# Patient Record
Sex: Female | Born: 1995 | Race: White | Hispanic: No | Marital: Married | Smoking: Never smoker
Health system: Southern US, Community
[De-identification: ages and names within clinical notes are randomized; demographics above are authoritative.]

## PROBLEM LIST (undated history)

## (undated) DIAGNOSIS — N390 Urinary tract infection, site not specified: Secondary | ICD-10-CM

## (undated) DIAGNOSIS — F411 Generalized anxiety disorder: Secondary | ICD-10-CM

## (undated) DIAGNOSIS — K580 Irritable bowel syndrome with diarrhea: Secondary | ICD-10-CM

## (undated) HISTORY — PX: APPENDECTOMY: SHX54

---
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
## 2016-06-27 ENCOUNTER — Emergency Department (HOSPITAL_BASED_OUTPATIENT_CLINIC_OR_DEPARTMENT_OTHER)
Admission: EM | Admit: 2016-06-27 | Discharge: 2016-06-27 | Disposition: A | Discharge status: Home / Self Care | Payer: Commercial Managed Care - PPO | Attending: Emergency Medicine | Admitting: Emergency Medicine

## 2016-06-27 ENCOUNTER — Encounter (HOSPITAL_BASED_OUTPATIENT_CLINIC_OR_DEPARTMENT_OTHER): Payer: Self-pay | Admitting: Emergency Medicine

## 2016-06-27 DIAGNOSIS — R197 Diarrhea, unspecified: Secondary | ICD-10-CM | POA: Diagnosis not present

## 2016-06-27 DIAGNOSIS — R109 Unspecified abdominal pain: Secondary | ICD-10-CM

## 2016-06-27 DIAGNOSIS — R509 Fever, unspecified: Secondary | ICD-10-CM | POA: Diagnosis not present

## 2016-06-27 DIAGNOSIS — M791 Myalgia: Secondary | ICD-10-CM | POA: Diagnosis not present

## 2016-06-27 DIAGNOSIS — R112 Nausea with vomiting, unspecified: Secondary | ICD-10-CM | POA: Diagnosis not present

## 2016-06-27 DIAGNOSIS — R1013 Epigastric pain: Secondary | ICD-10-CM | POA: Insufficient documentation

## 2016-06-27 DIAGNOSIS — R0602 Shortness of breath: Secondary | ICD-10-CM | POA: Insufficient documentation

## 2016-06-27 LAB — CBC
HCT: 41.3 % (ref 36.0–46.0)
HEMOGLOBIN: 14.2 g/dL (ref 12.0–15.0)
MCH: 32 pg (ref 26.0–34.0)
MCHC: 34.4 g/dL (ref 30.0–36.0)
MCV: 93 fL (ref 78.0–100.0)
Platelets: 192 10*3/uL (ref 150–400)
RBC: 4.44 MIL/uL (ref 3.87–5.11)
RDW: 13.4 % (ref 11.5–15.5)
WBC: 11 10*3/uL — AB (ref 4.0–10.5)

## 2016-06-27 LAB — COMPREHENSIVE METABOLIC PANEL
ALK PHOS: 56 U/L (ref 38–126)
ALT: 18 U/L (ref 14–54)
ANION GAP: 9 (ref 5–15)
AST: 25 U/L (ref 15–41)
Albumin: 4.4 g/dL (ref 3.5–5.0)
BUN: 19 mg/dL (ref 6–20)
CALCIUM: 9.3 mg/dL (ref 8.9–10.3)
CO2: 22 mmol/L (ref 22–32)
Chloride: 106 mmol/L (ref 101–111)
Creatinine, Ser: 0.9 mg/dL (ref 0.44–1.00)
GFR calc Af Amer: >60 mL/min (ref >60)
GFR calc non Af Amer: >60 mL/min (ref >60)
Glucose, Bld: 114 mg/dL — ABNORMAL HIGH (ref 65–99)
Potassium: 3.8 mmol/L (ref 3.5–5.1)
SODIUM: 137 mmol/L (ref 135–145)
Total Bilirubin: 1.4 mg/dL — ABNORMAL HIGH (ref 0.3–1.2)
Total Protein: 7.5 g/dL (ref 6.5–8.1)

## 2016-06-27 LAB — URINALYSIS, MICROSCOPIC (REFLEX)

## 2016-06-27 LAB — URINALYSIS, ROUTINE W REFLEX MICROSCOPIC
Bilirubin Urine: NEGATIVE
Glucose, UA: NEGATIVE mg/dL
Ketones, ur: 15 mg/dL — AB
LEUKOCYTES UA: NEGATIVE
NITRITE: NEGATIVE
Protein, ur: NEGATIVE mg/dL
SPECIFIC GRAVITY, URINE: 1.021 (ref 1.005–1.030)
pH: 6 (ref 5.0–8.0)

## 2016-06-27 LAB — PREGNANCY, URINE: Preg Test, Ur: NEGATIVE

## 2016-06-27 LAB — LIPASE, BLOOD: LIPASE: 21 U/L (ref 11–51)

## 2016-06-27 MED ORDER — ONDANSETRON HCL 4 MG/2ML IJ SOLN
4.0000 mg | Freq: Once | INTRAMUSCULAR | Status: AC
  Administered 2016-06-27: 4 mg via INTRAVENOUS
  Filled 2016-06-27: qty 2

## 2016-06-27 MED ORDER — SODIUM CHLORIDE 0.9 % IV BOLUS (SEPSIS)
1000.0000 mL | Freq: Once | INTRAVENOUS | Status: AC
  Administered 2016-06-27: 1000 mL via INTRAVENOUS

## 2016-06-27 MED ORDER — ONDANSETRON 4 MG PO TBDP: 4.0000 mg | ORAL_TABLET | Freq: Three times a day (TID) | ORAL | 0 refills | Status: DC | PRN

## 2016-06-27 MED ORDER — ONDANSETRON HCL 4 MG/2ML IJ SOLN
4.0000 mg | Freq: Once | INTRAMUSCULAR | Status: AC | PRN
  Administered 2016-06-27: 4 mg via INTRAVENOUS
  Filled 2016-06-27: qty 2

## 2016-06-27 MED ORDER — RANITIDINE HCL 150 MG PO CAPS: 150.0000 mg | ORAL_CAPSULE | Freq: Every day | ORAL | 0 refills | Status: AC

## 2016-06-27 MED ORDER — DICYCLOMINE HCL 10 MG/ML IM SOLN
20.0000 mg | Freq: Once | INTRAMUSCULAR | Status: AC
  Administered 2016-06-27: 20 mg via INTRAMUSCULAR
  Filled 2016-06-27: qty 2

## 2016-06-27 MED ORDER — DICYCLOMINE HCL 20 MG PO TABS: 20.0000 mg | ORAL_TABLET | Freq: Two times a day (BID) | ORAL | 0 refills | Status: DC

## 2016-06-27 MED ORDER — GI COCKTAIL ~~LOC~~
30.0000 mL | Freq: Once | ORAL | Status: AC
  Administered 2016-06-27: 30 mL via ORAL
  Filled 2016-06-27: qty 30

## 2016-06-27 MED FILL — DICYCLOMINE 20 MG TABLET: 20 | 10 days supply | Qty: 20 | Fill #0

## 2016-06-27 MED FILL — ONDANSETRON ODT 4 MG TABLET: 4 | 2 days supply | Qty: 9 | Fill #0

## 2016-06-27 MED FILL — SM ACID REDUCER 150 MG TAB: 150 MG | 65 days supply | Qty: 65 | Fill #0

## 2016-06-27 NOTE — ED Provider Notes (Signed)
[facility]   CSN: [id number] Arrival date & time: 06/27/16  0749     History   Chief Complaint Chief Complaint  Patient presents with  . Emesis    HPI Kristina Fox is a 21 y.o. female.  HPI   21 yo female with no significant medical history presents with nausea, vomiting and diarrhea. Symptoms began last night. Has associated body aches.  Started out of nowhere last night. After dinner threw up, and continued through night 10-15 times and diarrhea 6 times.  Has been nonstop, not feeling better after emesis.  Abdominal pain all over, mainly in epigastrum. At first felt like cramping, now feels like feeling of emptiness.  Fever last night 100.2.  No known sick contacts, no travel, recent antibiotics. Works in day care.    Past Medical History:  Diagnosis Date  . UTI (lower urinary tract infection)     There are no active problems to display for this patient.   Past Surgical History:  Procedure Laterality Date  . APPENDECTOMY    . LAPAROSCOPIC APPENDECTOMY  11/25/2011   Procedure: APPENDECTOMY LAPAROSCOPIC;  Surgeon: Judie Petit. Leonia Corona, MD;  Location: [facility];  Service: Pediatrics;  Laterality: N/A;    OB History    No data available       Home Medications    Prior to Admission medications   Medication Sig Start Date End Date Taking? Authorizing Provider  dicyclomine (BENTYL) 20 MG tablet Take 1 tablet (20 mg total) by mouth 2 (two) times daily. 06/27/16   Alvira Monday, MD  ondansetron (ZOFRAN ODT) 4 MG disintegrating tablet Take 1 tablet (4 mg total) by mouth every 8 (eight) hours as needed for nausea or vomiting. 06/27/16   Alvira Monday, MD  ondansetron (ZOFRAN ODT) 4 MG disintegrating tablet Take 1-2 tablets (4-8 mg total) by mouth every 8 (eight) hours as needed for nausea or vomiting. 06/27/16   Alvira Monday, MD  ranitidine (ZANTAC) 150 MG capsule Take 1 capsule (150 mg total) by mouth daily. 06/27/16   Alvira Monday, MD     Family History History reviewed. No pertinent family history.  Social History Social History  Substance Use Topics  . Smoking status: Never Smoker  . Smokeless tobacco: Never Used  . Alcohol use No     Allergies   Patient has no known allergies.   Review of Systems Review of Systems  Constitutional: Positive for fever.  HENT: Negative for sore throat.   Eyes: Negative for visual disturbance.  Respiratory: Positive for shortness of breath (after vomiting, felt like may be anxiety). Negative for cough.   Cardiovascular: Negative for chest pain.  Gastrointestinal: Positive for diarrhea, nausea and vomiting. Negative for abdominal pain.  Genitourinary: Negative for difficulty urinating and dysuria.  Musculoskeletal: Positive for myalgias. Negative for back pain and neck pain.  Skin: Negative for rash.  Neurological: Negative for syncope and headaches.     Physical Exam Updated Vital Signs BP (!) 110/48 (BP Location: Right Arm)   Pulse 67   Temp 99.6 F (37.6 C) (Oral)   Resp 18   Ht  (1.702 m)   Wt 190 lb (86.2 kg)   LMP 06/27/2016   SpO2 100%   BMI 29.76 kg/m   Physical Exam  Constitutional: She is oriented to person, place, and time. She appears well-developed and well-nourished. No distress.  HENT:  Head: Normocephalic and atraumatic.  Eyes: Conjunctivae and EOM are normal.  Neck: Normal range of motion.  Cardiovascular: Normal rate, regular rhythm, normal heart sounds and intact distal pulses.  Exam reveals no gallop and no friction rub.   No murmur heard. Pulmonary/Chest: Effort normal and breath sounds normal. No respiratory distress. She has no wheezes. She has no rales.  Abdominal: Soft. She exhibits no distension. There is no tenderness. There is no guarding.  Musculoskeletal: She exhibits no edema or tenderness.  Neurological: She is alert and oriented to person, place, and time.  Skin: Skin is warm and dry. No rash noted. She is not  diaphoretic. No erythema.  Nursing note and vitals reviewed.    ED Treatments / Results  Labs (all labs ordered are listed, but only abnormal results are displayed) Labs Reviewed  COMPREHENSIVE METABOLIC PANEL - Abnormal; Notable for the following:       Result Value   Glucose, Bld 114 (*)    Total Bilirubin 1.4 (*)    All other components within normal limits  CBC - Abnormal; Notable for the following:    WBC 11.0 (*)    All other components within normal limits  URINALYSIS, ROUTINE W REFLEX MICROSCOPIC - Abnormal; Notable for the following:    Hgb urine dipstick LARGE (*)    Ketones, ur 15 (*)    All other components within normal limits  URINALYSIS, MICROSCOPIC (REFLEX) - Abnormal; Notable for the following:    Bacteria, UA RARE (*)    Squamous Epithelial / LPF 0-5 (*)    All other components within normal limits  LIPASE, BLOOD  PREGNANCY, URINE    EKG  EKG Interpretation None       Radiology No results found.  Procedures Procedures (including critical care time)  Medications Ordered in ED Medications  ondansetron (ZOFRAN) injection 4 mg (4 mg Intravenous Given 06/27/16 0819)  sodium chloride 0.9 % bolus 1,000 mL (0 mLs Intravenous Stopped 06/27/16 0939)  ondansetron (ZOFRAN) injection 4 mg (4 mg Intravenous Given 06/27/16 0954)  dicyclomine (BENTYL) injection 20 mg (20 mg Intramuscular Given 06/27/16 1052)  gi cocktail (Maalox,Lidocaine,Donnatal) (30 mLs Oral Given 06/27/16 1052)     Initial Impression / Assessment and Plan / ED Course  I have reviewed the triage vital signs and the nursing notes.  Pertinent labs & imaging results that were available during my care of the patient were reviewed by me and considered in my medical decision making (see chart for details).     21 yo female with hx of appendectomy, IBS, anxiety presents with nausea, vomiting and diarrhea.  Abdominal exam benign, doubt cholecystitis, SBO. Pregnancy test negative.  Labs show no UTI,  bili 1.4 with no transaminase abnormalities, WBC 11.  Given IV fluids and zofran x8mg . Able to tolerate po.  Pt reports continuing epigastric pain, given GI cocktail and bentyl with improvement.  Repeat examinations show no significant RUQ tenderness, negative murphy's sign. Discussed with patient option for [place], however at this time given suspected sick contacts at day care, diarrhea, no significant tenderness on exam I have low suspicion for cholecystitis and feel continued supportive care with strict return precautions for worsening pain is also appropriate. Patient would like to continue supportive care, return for worsening pain, high fevers, inability to tolerate po.    Given rx for zofran, bentyl, ranitidine. Patient discharged in stable condition with understanding of reasons to return.   Final Clinical Impressions(s) / ED Diagnoses   Final diagnoses:  Nausea vomiting and diarrhea  Abdominal cramping    New Prescriptions Discharge Medication List as of 06/27/2016  12:51 PM    START taking these medications   Details  dicyclomine (BENTYL) 20 MG tablet Take 1 tablet (20 mg total) by mouth 2 (two) times daily., Starting Thu 06/27/2016, Print    !! ondansetron (ZOFRAN ODT) 4 MG disintegrating tablet Take 1 tablet (4 mg total) by mouth every 8 (eight) hours as needed for nausea or vomiting., Starting Thu 06/27/2016, Print    !! ondansetron (ZOFRAN ODT) 4 MG disintegrating tablet Take 1-2 tablets (4-8 mg total) by mouth every 8 (eight) hours as needed for nausea or vomiting., Starting Thu 06/27/2016, Print    ranitidine (ZANTAC) 150 MG capsule Take 1 capsule (150 mg total) by mouth daily., Starting Thu 06/27/2016, Print     !! - Potential duplicate medications found. Please discuss with provider.       Alvira Monday, MD 06/27/16 2023

## 2016-06-27 NOTE — ED Triage Notes (Signed)
Patient states that she has been up all night with N/V/d  - reports generalize body aches

## 2016-07-01 ENCOUNTER — Emergency Department (HOSPITAL_BASED_OUTPATIENT_CLINIC_OR_DEPARTMENT_OTHER)
Admission: EM | Admit: 2016-07-01 | Discharge: 2016-07-01 | Disposition: A | Discharge status: Home / Self Care | Payer: Commercial Managed Care - PPO | Attending: Emergency Medicine | Admitting: Emergency Medicine

## 2016-07-01 ENCOUNTER — Encounter (HOSPITAL_BASED_OUTPATIENT_CLINIC_OR_DEPARTMENT_OTHER): Payer: Self-pay

## 2016-07-01 DIAGNOSIS — R109 Unspecified abdominal pain: Secondary | ICD-10-CM | POA: Diagnosis present

## 2016-07-01 DIAGNOSIS — A020 Salmonella enteritis: Secondary | ICD-10-CM | POA: Diagnosis not present

## 2016-07-01 HISTORY — DX: Irritable bowel syndrome with diarrhea: K58.0

## 2016-07-01 HISTORY — DX: Generalized anxiety disorder: F41.1

## 2016-07-01 LAB — URINALYSIS, ROUTINE W REFLEX MICROSCOPIC
Bilirubin Urine: NEGATIVE
GLUCOSE, UA: NEGATIVE mg/dL
Ketones, ur: NEGATIVE mg/dL
LEUKOCYTES UA: NEGATIVE
Nitrite: NEGATIVE
PH: 6 (ref 5.0–8.0)
Protein, ur: NEGATIVE mg/dL
SPECIFIC GRAVITY, URINE: 1.012 (ref 1.005–1.030)

## 2016-07-01 LAB — URINALYSIS, MICROSCOPIC (REFLEX): WBC, UA: NONE SEEN WBC/hpf (ref 0–5)

## 2016-07-01 LAB — PREGNANCY, URINE: Preg Test, Ur: NEGATIVE

## 2016-07-01 NOTE — ED Notes (Signed)
EMT checked pulse manually to verify pulse was in the 40's. RN notified.

## 2016-07-01 NOTE — Discharge Instructions (Signed)
Return to the emergency department if your symptoms significantly worsen or change. 

## 2016-07-01 NOTE — ED Notes (Signed)
ED Provider at bedside. 

## 2016-07-01 NOTE — ED Provider Notes (Signed)
[facility]   CSN: [id number] Arrival date & time: 07/01/16  4098   By signing my name below, I, Teofilo Pod, attest that this documentation has been prepared under the direction and in the presence of Geoffery Lyons, MD . Electronically Signed: Teofilo Pod, ED Scribe. 07/01/2016. 9:54 PM.   History   Chief Complaint Chief Complaint  Patient presents with  . Abdominal Pain    The history is provided by the patient. No language interpreter was used.   HPI Comments:  Kristina Fox is a 21 y.o. female who presents to the Emergency Department complaining of constant abdominal pain x 5 days. Mom reports that pt ate eggs 3 times last week, and the eggs that she ate were recalled due to salmonella. Pt was seen 5 days ago after vomiting several times over the night and was treated with Zofran with mild relief for nausea. The abdominal pain has moderately improved since. Pt notes that her BM was green this AM. She states that she is otherwise healthy. Pt denies other associated symptoms.   Past Medical History:  Diagnosis Date  . Generalized anxiety disorder   . Irritable bowel syndrome with diarrhea   . UTI (lower urinary tract infection)     There are no active problems to display for this patient.   Past Surgical History:  Procedure Laterality Date  . APPENDECTOMY    . LAPAROSCOPIC APPENDECTOMY  11/25/2011   Procedure: APPENDECTOMY LAPAROSCOPIC;  Surgeon: Judie Petit. Leonia Corona, MD;  Location: [facility];  Service: Pediatrics;  Laterality: N/A;    OB History    No data available       Home Medications    Prior to Admission medications   Medication Sig Start Date End Date Taking? Authorizing Provider  dicyclomine (BENTYL) 20 MG tablet Take 1 tablet (20 mg total) by mouth 2 (two) times daily. 06/27/16   Alvira Monday, MD  ondansetron (ZOFRAN ODT) 4 MG disintegrating tablet Take 1 tablet (4 mg total) by mouth every 8 (eight) hours as needed for  nausea or vomiting. 06/27/16   Alvira Monday, MD  ondansetron (ZOFRAN ODT) 4 MG disintegrating tablet Take 1-2 tablets (4-8 mg total) by mouth every 8 (eight) hours as needed for nausea or vomiting. 06/27/16   Alvira Monday, MD  ranitidine (ZANTAC) 150 MG capsule Take 1 capsule (150 mg total) by mouth daily. 06/27/16   Alvira Monday, MD    Family History No family history on file.  Social History Social History  Substance Use Topics  . Smoking status: Never Smoker  . Smokeless tobacco: Never Used  . Alcohol use No     Allergies   Patient has no known allergies.   Review of Systems Review of Systems  Constitutional: Negative for fever.  Gastrointestinal: Positive for abdominal pain, diarrhea, nausea and vomiting.  All other systems reviewed and are negative.    Physical Exam Updated Vital Signs BP (!) 125/56 (BP Location: Right Arm)   Pulse (!) 46   Temp 97.8 F (36.6 C) (Oral)   Resp 16   Ht  (1.702 m)   Wt 189 lb (85.7 kg)   LMP 06/27/2016   SpO2 100%   BMI 29.60 kg/m   Physical Exam  Constitutional: She is oriented to person, place, and time. She appears well-developed and well-nourished. No distress.  HENT:  Head: Normocephalic and atraumatic.  Eyes: EOM are normal.  Neck: Normal range of motion.  Cardiovascular: Normal rate, regular rhythm  and normal heart sounds.   Pulmonary/Chest: Effort normal and breath sounds normal.  Abdominal: Soft. She exhibits no distension. There is no tenderness.  Musculoskeletal: Normal range of motion.  Neurological: She is alert and oriented to person, place, and time.  Skin: Skin is warm and dry.  Psychiatric: She has a normal mood and affect. Judgment normal.  Nursing note and vitals reviewed.    ED Treatments / Results  DIAGNOSTIC STUDIES:  Oxygen Saturation is 100% on RA, normal by my interpretation.    COORDINATION OF CARE:  9:51 PM Discussed treatment plan with pt at bedside and pt agreed to plan.     Labs (all labs ordered are listed, but only abnormal results are displayed) Labs Reviewed  URINALYSIS, ROUTINE W REFLEX MICROSCOPIC - Abnormal; Notable for the following:       Result Value   Hgb urine dipstick MODERATE (*)    All other components within normal limits  URINALYSIS, MICROSCOPIC (REFLEX) - Abnormal; Notable for the following:    Bacteria, UA RARE (*)    Squamous Epithelial / LPF 0-5 (*)    All other components within normal limits  PREGNANCY, URINE    EKG  EKG Interpretation None       Radiology No results found.  Procedures Procedures (including critical care time)  Medications Ordered in ED Medications - No data to display   Initial Impression / Assessment and Plan / ED Course  I have reviewed the triage vital signs and the nursing notes.  Pertinent labs & imaging results that were available during my care of the patient were reviewed by me and considered in my medical decision making (see chart for details).  Patient was seen here several days ago for presumed gastroenteritis. Today, the mother realized that she had eaten eggs that were under the salmonella recall. The patient's mother called the primary Dr. who advised them to come to the ER to be evaluated even though the patient appears to be feeling better.  Her abdominal exam is benign and she is nontoxic appearing. She has had no vomiting and several days, but does still feel somewhat bloated and gassy. I see no indication for further workup. As she appears to be improving, I do not feel as though antibiotic therapy is indicated. To return as needed for any problems.  Final Clinical Impressions(s) / ED Diagnoses   Final diagnoses:  None    New Prescriptions New Prescriptions   No medications on file  I personally performed the services described in this documentation, which was scribed in my presence. The recorded information has been reviewed and is accurate.        Geoffery Lyons,  MD 07/02/16 [phone]

## 2016-07-01 NOTE — ED Triage Notes (Addendum)
Pt c/o abd cramping-was seen last week for n/v/d-pt realized she ate recalled eggs last week 2 days prior to sx-was advised by phone triage nurse to return to ED-pt NAD

## 2017-03-30 ENCOUNTER — Encounter (HOSPITAL_COMMUNITY): Payer: Self-pay | Admitting: Family Medicine

## 2017-03-30 ENCOUNTER — Ambulatory Visit (HOSPITAL_COMMUNITY)
Admission: EM | Admit: 2017-03-30 | Discharge: 2017-03-30 | Disposition: A | Discharge status: Home / Self Care | Payer: Commercial Managed Care - PPO | Attending: Family Medicine | Admitting: Family Medicine

## 2017-03-30 DIAGNOSIS — H6691 Otitis media, unspecified, right ear: Secondary | ICD-10-CM

## 2017-03-30 MED ORDER — HYDROCODONE-ACETAMINOPHEN 5-325 MG PO TABS: 1.0000 | ORAL_TABLET | Freq: Four times a day (QID) | ORAL | 0 refills | Status: DC | PRN

## 2017-03-30 MED ORDER — AMOXICILLIN 875 MG PO TABS: 875.0000 mg | ORAL_TABLET | Freq: Two times a day (BID) | ORAL | 0 refills | Status: DC

## 2017-03-30 NOTE — ED Provider Notes (Signed)
[facility]   [id number] 03/30/17 Arrival Time: 1848   SUBJECTIVE:  Kristina Fox is a 22 y.o. female who presents to the urgent care with complaint of very painful right ear and sore throat for 2 days.  She had similar symptoms 2 weeks ago and was seen but no diagnosis was forthcoming.  The symptoms resolved.  No fever.   Past Medical History:  Diagnosis Date  . Generalized anxiety disorder   . Irritable bowel syndrome with diarrhea   . UTI (lower urinary tract infection)    History reviewed. No pertinent family history. Social History   Socioeconomic History  . Marital status: Single    Spouse name: Not on file  . Number of children: Not on file  . Years of education: Not on file  . Highest education level: Not on file  Social Needs  . Financial resource strain: Not on file  . Food insecurity - worry: Not on file  . Food insecurity - inability: Not on file  . Transportation needs - medical: Not on file  . Transportation needs - non-medical: Not on file  Occupational History  . Not on file  Tobacco Use  . Smoking status: Never Smoker  . Smokeless tobacco: Never Used  Substance and Sexual Activity  . Alcohol use: No  . Drug use: No  . Sexual activity: Not on file  Other Topics Concern  . Not on file  Social History Narrative  . Not on file   No outpatient medications have been marked as taking for the 03/30/17 encounter [facility](Hospital Encounter).   No Known Allergies    ROS: As per HPI, remainder of ROS negative.   OBJECTIVE:   Vitals:   03/30/17 1918  BP: 118/66  Pulse: 83  Resp: 16  Temp: 98.5 F (36.9 C)  TempSrc: Oral  SpO2: 100%     General appearance: alert; no distress Eyes: PERRL; EOMI; conjunctiva normal HENT: normocephalic; atraumatic; right TM is deformed with wedge shaped white discoloration at 6 o'clock Neck: supple Back: no CVA tenderness Extremities: no cyanosis or edema; symmetrical with no gross deformities Skin: warm  and dry Neurologic: normal gait; grossly normal Psychological: alert and cooperative; normal mood and affect      Labs:  Results for orders placed or performed during the hospital encounter of 07/01/16  Pregnancy, urine  Result Value Ref Range   Preg Test, Ur NEGATIVE NEGATIVE  Urinalysis, Routine w reflex microscopic  Result Value Ref Range   Color, Urine YELLOW YELLOW   APPearance CLEAR CLEAR   Specific Gravity, Urine 1.012 1.005 - 1.030   pH 6.0 5.0 - 8.0   Glucose, UA NEGATIVE NEGATIVE mg/dL   Hgb urine dipstick MODERATE (A) NEGATIVE   Bilirubin Urine NEGATIVE NEGATIVE   Ketones, ur NEGATIVE NEGATIVE mg/dL   Protein, ur NEGATIVE NEGATIVE mg/dL   Nitrite NEGATIVE NEGATIVE   Leukocytes, UA NEGATIVE NEGATIVE  Urinalysis, Microscopic (reflex)  Result Value Ref Range   RBC / HPF 6-30 0 - 5 RBC/hpf   WBC, UA NONE SEEN 0 - 5 WBC/hpf   Bacteria, UA RARE (A) NONE SEEN   Squamous Epithelial / LPF 0-5 (A) NONE SEEN    Labs Reviewed - No data to display  No results found.     ASSESSMENT & PLAN:  1. Right otitis media, unspecified otitis media type     Meds ordered this encounter  Medications  . amoxicillin (AMOXIL) 875 MG tablet    Sig: Take 1 tablet (875  mg total) by mouth 2 (two) times daily.    Dispense:  20 tablet    Refill:  0  . HYDROcodone-acetaminophen (NORCO) 5-325 MG tablet    Sig: Take 1 tablet by mouth every 6 (six) hours as needed for moderate pain.    Dispense:  5 tablet    Refill:  0    Reviewed expectations re: course of current medical issues. Questions answered. Outlined signs and symptoms indicating need for more acute intervention. Patient verbalized understanding. After Visit Summary given.    Procedures:      Elvina Sidle, MD 03/30/17 1925

## 2017-03-30 NOTE — ED Triage Notes (Signed)
PT C/O: sore throat onset 2 days associated w/odonyphagia and right ear pain, nasal drainage, chills  DENIES: fevers  TAKING MEDS: Advil cold and sinu  A&O x4... NAD... Ambulatory

## 2022-08-12 ENCOUNTER — Emergency Department (HOSPITAL_COMMUNITY): Payer: 59

## 2022-08-12 ENCOUNTER — Other Ambulatory Visit: Payer: Self-pay

## 2022-08-12 ENCOUNTER — Encounter (HOSPITAL_COMMUNITY): Payer: Self-pay

## 2022-08-12 ENCOUNTER — Emergency Department (HOSPITAL_COMMUNITY)
Admission: EM | Admit: 2022-08-12 | Discharge: 2022-08-12 | Disposition: A | Discharge status: Home / Self Care | Payer: 59 | Attending: Emergency Medicine | Admitting: Emergency Medicine

## 2022-08-12 DIAGNOSIS — R072 Precordial pain: Secondary | ICD-10-CM | POA: Insufficient documentation

## 2022-08-12 DIAGNOSIS — R079 Chest pain, unspecified: Secondary | ICD-10-CM

## 2022-08-12 LAB — BASIC METABOLIC PANEL
Anion gap: 14 (ref 5–15)
BUN: 11 mg/dL (ref 6–20)
CO2: 19 mmol/L — ABNORMAL LOW (ref 22–32)
Calcium: 9.2 mg/dL (ref 8.9–10.3)
Chloride: 105 mmol/L (ref 98–111)
Creatinine, Ser: 0.96 mg/dL (ref 0.44–1.00)
GFR, Estimated: >60 mL/min (ref >60)
Glucose, Bld: 100 mg/dL — ABNORMAL HIGH (ref 70–99)
Potassium: 3.4 mmol/L — ABNORMAL LOW (ref 3.5–5.1)
Sodium: 138 mmol/L (ref 135–145)

## 2022-08-12 LAB — CBC
HCT: 41.2 % (ref 36.0–46.0)
Hemoglobin: 14.1 g/dL (ref 12.0–15.0)
MCH: 31.2 pg (ref 26.0–34.0)
MCHC: 34.2 g/dL (ref 30.0–36.0)
MCV: 91.2 fL (ref 80.0–100.0)
Platelets: 228 10*3/uL (ref 150–400)
RBC: 4.52 MIL/uL (ref 3.87–5.11)
RDW: 12.8 % (ref 11.5–15.5)
WBC: 8.8 10*3/uL (ref 4.0–10.5)
nRBC: 0 % (ref 0.0–0.2)

## 2022-08-12 LAB — I-STAT BETA HCG BLOOD, ED (MC, WL, AP ONLY): I-stat hCG, quantitative: <5 m[IU]/mL (ref <5)

## 2022-08-12 LAB — TROPONIN I (HIGH SENSITIVITY)
Troponin I (High Sensitivity): <2 ng/L (ref <18)
Troponin I (High Sensitivity): <2 ng/L (ref <18)

## 2022-08-12 MED ORDER — ACETAMINOPHEN 500 MG PO TABS
1000.0000 mg | ORAL_TABLET | ORAL | Status: AC
  Administered 2022-08-12: 1000 mg via ORAL
  Filled 2022-08-12: qty 2

## 2022-08-12 NOTE — ED Provider Notes (Signed)
[place] [facility]MENT AT [facility] Provider Note   CSN: [id number] Arrival date & time: 08/12/22  0249     History  Chief Complaint  Patient presents with   Chest Pain    Patient to ED via EMS with complaint of chest pain and hypotension x 1 hour. EMS reports giving patient 4 mg morphine IV in route.    Kristina Fox is a 27 y.o. female.   Chest Pain  Patient is a 27 year old female with no pertinent past medical history present emergency room today with complaints of sternal chest pain that woke her up from her sleep approximately an hour before arrival in the emergency department.  She denies any nausea or vomiting.  She states that her chest pain has improved since arrival in the emergency department.  She was given morphine by EMS in transit.  She states that the pain is sternal nonradiating nonexertional and seems to be constant does not seem to be aggravated by breathing coughing or moving around.  She states that it does seem to actually somewhat improved when she hunches forward.  She denies any trauma to her chest no new workouts.  No syncope or near syncope and no hemoptysis.  She denies any other associated symptoms  No recent surgeries, hospitalization, long travel, hemoptysis, estrogen containing OCP, cancer history.  No unilateral leg swelling.  No history of PE or VTE.     Home Medications Prior to Admission medications   Medication Sig Start Date End Date Taking? Authorizing Provider  amoxicillin (AMOXIL) 875 MG tablet Take 1 tablet (875 mg total) by mouth 2 (two) times daily. 03/30/17   Elvina Sidle, MD  dicyclomine (BENTYL) 20 MG tablet Take 1 tablet (20 mg total) by mouth 2 (two) times daily. 06/27/16   Alvira Monday, MD  HYDROcodone-acetaminophen (NORCO) 5-325 MG tablet Take 1 tablet by mouth every 6 (six) hours as needed for moderate pain. 03/30/17   Elvina Sidle, MD  ondansetron (ZOFRAN ODT) 4 MG disintegrating tablet Take 1  tablet (4 mg total) by mouth every 8 (eight) hours as needed for nausea or vomiting. 06/27/16   Alvira Monday, MD  ondansetron (ZOFRAN ODT) 4 MG disintegrating tablet Take 1-2 tablets (4-8 mg total) by mouth every 8 (eight) hours as needed for nausea or vomiting. 06/27/16   Alvira Monday, MD  ranitidine (ZANTAC) 150 MG capsule Take 1 capsule (150 mg total) by mouth daily. 06/27/16   Alvira Monday, MD      Allergies    Patient has no known allergies.    Review of Systems   Review of Systems  Cardiovascular:  Positive for chest pain.    Physical Exam Updated Vital Signs BP 114/67   Pulse 85   Temp (!) 97.5 F (36.4 C) (Oral)   Resp 17   Ht 5\' 7"  (1.702 m)   Wt 92.1 kg   SpO2 100%   BMI 31.79 kg/m  Physical Exam Vitals and nursing note reviewed.  Constitutional:      General: She is not in acute distress. HENT:     Head: Normocephalic and atraumatic.     Nose: Nose normal.     Mouth/Throat:     Mouth: Mucous membranes are moist.  Eyes:     General: No scleral icterus. Cardiovascular:     Rate and Rhythm: Normal rate and regular rhythm.     Pulses: Normal pulses.     Heart sounds: Normal heart sounds.  Pulmonary:  Effort: Pulmonary effort is normal. No respiratory distress.     Breath sounds: No wheezing.  Abdominal:     Palpations: Abdomen is soft.     Tenderness: There is no abdominal tenderness. There is no guarding or rebound.  Musculoskeletal:     Cervical back: Normal range of motion.     Right lower leg: No edema.     Left lower leg: No edema.     Comments: Legs symmetric  Skin:    General: Skin is warm and dry.     Capillary Refill: Capillary refill takes less than 2 seconds.  Neurological:     Mental Status: She is alert. Mental status is at baseline.  Psychiatric:        Mood and Affect: Mood normal.        Behavior: Behavior normal.     ED Results / Procedures / Treatments   Labs (all labs ordered are listed, but only abnormal results  are displayed) Labs Reviewed  BASIC METABOLIC PANEL - Abnormal; Notable for the following components:      Result Value   Potassium 3.4 (*)    CO2 19 (*)    Glucose, Bld 100 (*)    All other components within normal limits  CBC  I-STAT BETA HCG BLOOD, ED ([facility], [facility], [facility] ONLY)  TROPONIN I (HIGH SENSITIVITY)  TROPONIN I (HIGH SENSITIVITY)    EKG EKG Interpretation  Date/Time:  Monday Aug 12 2022 02:56:42 EDT Ventricular Rate:  65 PR Interval:  141 QRS Duration: 100 QT Interval:  427 QTC Calculation: 444 R Axis:   40 Text Interpretation: Sinus rhythm Normal ECG No old tracing to compare Confirmed by Dione Booze ([id number]) on 08/12/2022 2:58:34 AM  Radiology DG Chest 2 View  Result Date: 08/12/2022 CLINICAL DATA:  Chest pain. EXAM: CHEST - 2 VIEW COMPARISON:  None Available. FINDINGS: The heart size and mediastinal contours are within normal limits. Both lungs are clear. The visualized skeletal structures are unremarkable. IMPRESSION: No active cardiopulmonary disease. Electronically Signed   By: Aram Candela M.D.   On: 08/12/2022 03:42    Procedures Procedures    Medications Ordered in ED Medications  acetaminophen (TYLENOL) tablet 1,000 mg (1,000 mg Oral Given 08/12/22 0458)    ED Course/ Medical Decision Making/ A&P Clinical Course as of 08/12/22 6045  [facility] Aug 12, 2022  0459 Chest x-ray normal, EKG without evidence of acute ischemia CMP initial troponin normal.  hCG negative for pregnancy, second troponin pending [WF]    Clinical Course User Index [WF] Gailen Shelter, PA                             Medical Decision Making Amount and/or Complexity of Data Reviewed Labs: ordered. Radiology: ordered.  Risk OTC drugs.   This patient presents to the ED for concern of CP, this involves a number of treatment options, and is a complaint that carries with it a moderate risk of complications and morbidity. A differential diagnosis was considered for the patient's  symptoms which is discussed below:   The emergent causes of chest pain include: Acute coronary syndrome, tamponade, pericarditis/myocarditis, aortic dissection, pulmonary embolism, tension pneumothorax, pneumonia, and esophageal rupture.   I do not believe the patient has an emergent cause of chest pain, other urgent/non-acute considerations include, but are not limited to: chronic angina, aortic stenosis, cardiomyopathy, mitral valve prolapse, pulmonary hypertension, aortic insufficiency, right ventricular hypertrophy, pleuritis, bronchitis, pneumothorax, tumor,  gastroesophageal reflux disease (GERD), esophageal spasm, Mallory-Weiss syndrome, peptic ulcer disease, pancreatitis, functional gastrointestinal pain, cervical or thoracic disk disease or arthritis, shoulder arthritis, costochondritis, subacromial bursitis, anxiety or panic attack, herpes zoster, breast disorders, chest wall tumors, thoracic outlet syndrome, mediastinitis.    Co morbidities: Discussed in HPI   Brief History:  Patient is a 27 year old female with no pertinent past medical history present emergency room today with complaints of sternal chest pain that woke her up from her sleep approximately an hour before arrival in the emergency department.  She denies any nausea or vomiting.  She states that her chest pain has improved since arrival in the emergency department.  She was given morphine by EMS in transit.  She states that the pain is sternal nonradiating nonexertional and seems to be constant does not seem to be aggravated by breathing coughing or moving around.  She states that it does seem to actually somewhat improved when she hunches forward.  She denies any trauma to her chest no new workouts.  No syncope or near syncope and no hemoptysis.  She denies any other associated symptoms  No recent surgeries, hospitalization, long travel, hemoptysis, estrogen containing OCP, cancer history.  No unilateral leg swelling.  No  history of PE or VTE.     EMR reviewed including pt PMHx, past surgical history and past visits to ER.   See HPI for more details   Lab Tests:   I personally reviewed all laboratory work and imaging. Metabolic panel without any acute abnormality specifically kidney function within normal limits and no significant electrolyte abnormalities. CBC without leukocytosis or significant anemia. Troponin x 1 within normal limits/undetectable, i-STAT hCG negative pregnancy  Imaging Studies:  NAD. I personally reviewed all imaging studies and no acute abnormality found. I agree with radiology interpretation.    Cardiac Monitoring:  The patient was maintained on a cardiac monitor.  I personally viewed and interpreted the cardiac monitored which showed an underlying rhythm of: NSR EKG non-ischemic   Medicines ordered:  I ordered medication including Tylenol for pain Reevaluation of the patient after these medicines showed that the patient resolved I have reviewed the patients home medicines and have made adjustments as needed   Critical Interventions:     Consults/Attending Physician      Reevaluation:  After the interventions noted above I re-evaluated patient and found that they have :resolved   Social Determinants of Health:      Problem List / ED Course:  Patient with very atypical chest pain that awoke her from sleep.  Nonpleuritic nonexertional no radiation of pain seems to be sternal.  Better when she is hunched over but does not seem to be better or worse when she is leaning forward or reclined.  No dyspnea no hemoptysis PERC negative and I have very low suspicion for pulmonary embolism doubt myocarditis given normal initial troponin no recent viral illnesses that may have precipitated pericarditis.  Normal vital signs have a very low suspicion for tamponade.  Dissection very unlikely no neurologic symptoms no ripping tearing pain or radiation of back and no risk  factors for dissection.  Ultimately patient is now chest pain-free on reevaluation.  Pending second troponin and anticipate discharge home.   Dispostion:  Anticipate discharge home.  Patient care transferred to [facility] for re-evaluation and likely DC home after 2nd troponin.     Final Clinical Impression(s) / ED Diagnoses Final diagnoses:  None    Rx / DC Orders ED Discharge Orders  None         Solon Augusta [place], [place] 08/12/22 1610    Dione Booze, MD 08/12/22 [phone]

## 2022-08-12 NOTE — ED Provider Notes (Signed)
Care assumed from previous provider.  See note for full HPI  Intubation 27 year old here for evaluation of chest pain which began 1 hour PTA.  Pain nonexertional, nonpleuritic in nature.  No nausea, vomiting, shortness of breath, abdominal pain, back pain, lower extremity pain or swelling.  No history of PE or DVT.  No syncope, cough, URI symptoms  Workup thus far has been unremarkable.  Plan on follow-up on repeat trop, if neg plan to dc home.  Physical Exam  BP 114/67   Pulse 85   Temp (!) 97.5 F (36.4 C) (Oral)   Resp 17   Ht 5\' 7"  (1.702 m)   Wt 92.1 kg   SpO2 100%   BMI 31.79 kg/m   Physical Exam Vitals and nursing note reviewed.  Constitutional:      General: She is not in acute distress.    Appearance: She is well-developed. She is not ill-appearing.  HENT:     Head: Atraumatic.  Eyes:     Pupils: Pupils are equal, round, and reactive to light.  Cardiovascular:     Rate and Rhythm: Normal rate.     Pulses:          Radial pulses are 2+ on the right side and 2+ on the left side.       Dorsalis pedis pulses are 2+ on the right side and 2+ on the left side.     Heart sounds: Normal heart sounds.  Pulmonary:     Effort: Pulmonary effort is normal. No respiratory distress.     Breath sounds: Normal breath sounds.     Comments: Clear bilaterally, speaks in full sentences without difficulty Abdominal:     General: Bowel sounds are normal. There is no distension.     Palpations: Abdomen is soft.     Comments: Soft, nontender  Musculoskeletal:        General: Normal range of motion.     Cervical back: Normal range of motion.     Right lower leg: No tenderness. No edema.     Left lower leg: No tenderness. No edema.     Comments: Moves all 4 extremities at difficulty, compartments soft, nontender posterior calves 2+ DP pulses bilaterally  Skin:    General: Skin is warm and dry.     Capillary Refill: Capillary refill takes less than 2 seconds.  Neurological:      General: No focal deficit present.     Mental Status: She is alert.  Psychiatric:        Mood and Affect: Mood normal.     Procedures  Procedures Labs Reviewed  BASIC METABOLIC PANEL - Abnormal; Notable for the following components:      Result Value   Potassium 3.4 (*)    CO2 19 (*)    Glucose, Bld 100 (*)    All other components within normal limits  CBC  I-STAT BETA HCG BLOOD, ED ([facility], [facility], [facility] ONLY)  TROPONIN I (HIGH SENSITIVITY)  TROPONIN I (HIGH SENSITIVITY)   DG Chest 2 View  Result Date: 08/12/2022 CLINICAL DATA:  Chest pain. EXAM: CHEST - 2 VIEW COMPARISON:  None Available. FINDINGS: The heart size and mediastinal contours are within normal limits. Both lungs are clear. The visualized skeletal structures are unremarkable. IMPRESSION: No active cardiopulmonary disease. Electronically Signed   By: Aram Candela M.D.   On: 08/12/2022 03:42    ED Course / MDM  Care assumed from previous provider.  See note for full HPI  Intubation 27 year old here for evaluation of chest pain which began 1 hour PTA.  Pain nonexertional, nonpleuritic in nature.  No nausea, vomiting, shortness of breath, abdominal pain, back pain, lower extremity pain or swelling.  No history of PE or DVT.  No syncope, cough, URI symptoms  Workup thus far has been unremarkable.  Plan on follow-up on repeat trop, if neg plan to dc home.  PERC negative, Wells criteria low risk Heart score 0  Labs and imaging personally viewed and interpreted No significant abnormality  Patient assessed. CP Free.  She has no clinical evidence of VTE on exam, appears clinically well-hydrated.  Low suspicion for PE.  Symptoms not exertional in nature, low suspicion for ACS, unstable angina.  Normotensive low suspicion for hypertensive urgency or emergency.  Lungs clear bilaterally, no shortness of breath chest x-ray negative for pneumothorax, edema, cardiomegaly.  No crepitus on exam to suggest boerhaave. No syncope, potation to  suggest arrhythmia.  She is afebrile, low suspicion for myocarditis, pericarditis.  Her abdomen is soft, nontender, no nausea, vomiting, bloody stool, low suspicion for acute intra-abdominal etiology causing her symptoms.  The patient has been appropriately medically screened and/or stabilized in the ED. I have low suspicion for any other emergent medical condition which would require further screening, evaluation or treatment in the ED or require inpatient management.  Patient is hemodynamically stable and in no acute distress.  Patient able to ambulate in department prior to ED.  Evaluation does not show acute pathology that would require ongoing or additional emergent interventions while in the emergency department or further inpatient treatment.  I have discussed the diagnosis with the patient and answered all questions.  Pain is been managed while in the emergency department and patient has no further complaints prior to discharge.  Patient is comfortable with plan discussed in room and is stable for discharge at this time.  I have discussed strict return precautions for returning to the emergency department.  Patient was encouraged to follow-up with PCP/specialist refer to at discharge.    Clinical Course as of 08/12/22 2706  [facility] Aug 12, 2022  0459 Chest x-ray normal, EKG without evidence of acute ischemia CMP initial troponin normal.  hCG negative for pregnancy, second troponin pending [WF]    Clinical Course User Index [WF] Gailen Shelter, PA   Medical Decision Making Amount and/or Complexity of Data Reviewed Independent Historian: parent External Data Reviewed: labs, radiology, ECG and notes. Labs: ordered. Decision-making details documented in ED Course. Radiology: ordered and independent interpretation performed. Decision-making details documented in ED Course. ECG/medicine tests: ordered and independent interpretation performed. Decision-making details documented in ED  Course.  Risk OTC drugs. Prescription drug management. Decision regarding hospitalization. Diagnosis or treatment significantly limited by social determinants of health.     Chest pain     Tiffinie Caillier A, PA-C 08/12/22 2376    Pricilla Loveless, MD 08/12/22 1004

## 2022-08-12 NOTE — Discharge Instructions (Signed)
It was a pleasure taking care of you here in the emergency department  Your workup today was reassuring  Make sure to follow-up with your primary care provider  Return for new or worsening symptoms

## 2022-08-13 ENCOUNTER — Emergency Department (HOSPITAL_BASED_OUTPATIENT_CLINIC_OR_DEPARTMENT_OTHER): Payer: 59

## 2022-08-13 ENCOUNTER — Other Ambulatory Visit: Payer: Self-pay

## 2022-08-13 ENCOUNTER — Encounter (HOSPITAL_BASED_OUTPATIENT_CLINIC_OR_DEPARTMENT_OTHER): Payer: Self-pay | Admitting: Emergency Medicine

## 2022-08-13 ENCOUNTER — Observation Stay (HOSPITAL_BASED_OUTPATIENT_CLINIC_OR_DEPARTMENT_OTHER)
Admission: EM | Admit: 2022-08-13 | Discharge: 2022-08-15 | Disposition: A | Discharge status: Home / Self Care | Payer: 59 | Attending: Surgery | Admitting: Surgery

## 2022-08-13 DIAGNOSIS — R1013 Epigastric pain: Secondary | ICD-10-CM | POA: Diagnosis present

## 2022-08-13 DIAGNOSIS — F32A Depression, unspecified: Secondary | ICD-10-CM | POA: Diagnosis present

## 2022-08-13 DIAGNOSIS — K8000 Calculus of gallbladder with acute cholecystitis without obstruction: Principal | ICD-10-CM | POA: Insufficient documentation

## 2022-08-13 DIAGNOSIS — F411 Generalized anxiety disorder: Secondary | ICD-10-CM | POA: Diagnosis present

## 2022-08-13 DIAGNOSIS — K810 Acute cholecystitis: Principal | ICD-10-CM | POA: Diagnosis present

## 2022-08-13 DIAGNOSIS — Z79899 Other long term (current) drug therapy: Secondary | ICD-10-CM

## 2022-08-13 DIAGNOSIS — F419 Anxiety disorder, unspecified: Secondary | ICD-10-CM | POA: Diagnosis not present

## 2022-08-13 LAB — CBC WITH DIFFERENTIAL/PLATELET
Abs Immature Granulocytes: 0.02 10*3/uL (ref 0.00–0.07)
Basophils Absolute: 0.1 10*3/uL (ref 0.0–0.1)
Basophils Relative: 1 %
Eosinophils Absolute: 0 10*3/uL (ref 0.0–0.5)
Eosinophils Relative: 0 %
HCT: 40.1 % (ref 36.0–46.0)
Hemoglobin: 13.6 g/dL (ref 12.0–15.0)
Immature Granulocytes: 0 %
Lymphocytes Relative: 18 %
Lymphs Abs: 1.6 10*3/uL (ref 0.7–4.0)
MCH: 31.5 pg (ref 26.0–34.0)
MCHC: 33.9 g/dL (ref 30.0–36.0)
MCV: 92.8 fL (ref 80.0–100.0)
Monocytes Absolute: 0.6 10*3/uL (ref 0.1–1.0)
Monocytes Relative: 6 %
Neutro Abs: 6.5 10*3/uL (ref 1.7–7.7)
Neutrophils Relative %: 75 %
Platelets: 244 10*3/uL (ref 150–400)
RBC: 4.32 MIL/uL (ref 3.87–5.11)
RDW: 13 % (ref 11.5–15.5)
WBC: 8.7 10*3/uL (ref 4.0–10.5)
nRBC: 0 % (ref 0.0–0.2)

## 2022-08-13 LAB — SURGICAL PCR SCREEN
MRSA, PCR: NEGATIVE
Staphylococcus aureus: NEGATIVE

## 2022-08-13 LAB — COMPREHENSIVE METABOLIC PANEL
ALT: 26 U/L (ref 0–44)
AST: 27 U/L (ref 15–41)
Albumin: 4 g/dL (ref 3.5–5.0)
Alkaline Phosphatase: 46 U/L (ref 38–126)
Anion gap: 8 (ref 5–15)
BUN: 9 mg/dL (ref 6–20)
CO2: 25 mmol/L (ref 22–32)
Calcium: 9.1 mg/dL (ref 8.9–10.3)
Chloride: 104 mmol/L (ref 98–111)
Creatinine, Ser: 0.91 mg/dL (ref 0.44–1.00)
GFR, Estimated: >60 mL/min (ref >60)
Glucose, Bld: 95 mg/dL (ref 70–99)
Potassium: 4 mmol/L (ref 3.5–5.1)
Sodium: 137 mmol/L (ref 135–145)
Total Bilirubin: 0.7 mg/dL (ref 0.3–1.2)
Total Protein: 7.3 g/dL (ref 6.5–8.1)

## 2022-08-13 LAB — TROPONIN I (HIGH SENSITIVITY): Troponin I (High Sensitivity): <2 ng/L (ref <18)

## 2022-08-13 LAB — LIPASE, BLOOD: Lipase: 27 U/L (ref 11–51)

## 2022-08-13 MED ORDER — OXYCODONE HCL 5 MG PO TABS: 5.0000 mg | ORAL_TABLET | ORAL | Status: DC | PRN

## 2022-08-13 MED ORDER — ACETAMINOPHEN 325 MG PO TABS
650.0000 mg | ORAL_TABLET | Freq: Four times a day (QID) | ORAL | Status: DC
  Administered 2022-08-13 – 2022-08-15 (×3): 650 mg via ORAL
  Filled 2022-08-13 (×3): qty 2

## 2022-08-13 MED ORDER — ONDANSETRON HCL 4 MG/2ML IJ SOLN: 4.0000 mg | Freq: Three times a day (TID) | INTRAMUSCULAR | Status: DC | PRN

## 2022-08-13 MED ORDER — HYDROMORPHONE HCL 1 MG/ML IJ SOLN
1.0000 mg | INTRAMUSCULAR | Status: DC | PRN
  Administered 2022-08-14 (×2): 1 mg via INTRAVENOUS
  Filled 2022-08-13 (×2): qty 1

## 2022-08-13 MED ORDER — FENTANYL CITRATE PF 50 MCG/ML IJ SOSY: 50.0000 ug | PREFILLED_SYRINGE | INTRAMUSCULAR | Status: DC | PRN

## 2022-08-13 MED ORDER — LACTATED RINGERS IV SOLN: INTRAVENOUS | Status: DC

## 2022-08-13 MED ORDER — ENOXAPARIN SODIUM 40 MG/0.4ML IJ SOSY
40.0000 mg | PREFILLED_SYRINGE | INTRAMUSCULAR | Status: DC
  Administered 2022-08-13 – 2022-08-14 (×2): 40 mg via SUBCUTANEOUS
  Filled 2022-08-13 (×2): qty 0.4

## 2022-08-13 MED ORDER — ONDANSETRON HCL 4 MG/2ML IJ SOLN: 4.0000 mg | Freq: Four times a day (QID) | INTRAMUSCULAR | Status: DC | PRN

## 2022-08-13 MED ORDER — OXYCODONE HCL 5 MG PO TABS
10.0000 mg | ORAL_TABLET | ORAL | Status: DC | PRN
  Administered 2022-08-14: 10 mg via ORAL
  Filled 2022-08-13: qty 2

## 2022-08-13 MED ORDER — GABAPENTIN 300 MG PO CAPS
300.0000 mg | ORAL_CAPSULE | Freq: Three times a day (TID) | ORAL | Status: DC
  Administered 2022-08-14 (×2): 300 mg via ORAL
  Filled 2022-08-13 (×2): qty 3

## 2022-08-13 MED ORDER — PROCHLORPERAZINE EDISYLATE 10 MG/2ML IJ SOLN: 10.0000 mg | INTRAMUSCULAR | Status: DC | PRN

## 2022-08-13 MED ORDER — KETOROLAC TROMETHAMINE 15 MG/ML IJ SOLN
15.0000 mg | Freq: Three times a day (TID) | INTRAMUSCULAR | Status: DC
  Administered 2022-08-13 – 2022-08-15 (×4): 15 mg via INTRAVENOUS
  Filled 2022-08-13 (×4): qty 1

## 2022-08-13 MED ORDER — DOCUSATE SODIUM 100 MG PO CAPS
100.0000 mg | ORAL_CAPSULE | Freq: Two times a day (BID) | ORAL | Status: DC
  Administered 2022-08-14: 100 mg via ORAL
  Filled 2022-08-13: qty 1

## 2022-08-13 MED ORDER — SIMETHICONE 40 MG/0.6ML PO SUSP (UNIT DOSE)
80.0000 mg | Freq: Four times a day (QID) | ORAL | Status: DC | PRN
  Administered 2022-08-14: 80 mg via ORAL
  Filled 2022-08-13: qty 1.2

## 2022-08-13 NOTE — H&P (Signed)
Admitting Physician: Hyman Hopes Ellee Wawrzyniak  Service: General Surgery  CC: Abdominal pain  Subjective   HPI: Kristina Fox is an 27 y.o. female who is here for abdominal pain.  Pain located in the epigastric and chest areas.  Came into the ER twice and was found on right upper quadrant ultrasound to have signs of acute cholecystitis so a surgery evaluation was requested.  The pain causes some shortness of breath.  Past Medical History:  Diagnosis Date   Generalized anxiety disorder    Irritable bowel syndrome with diarrhea    UTI (lower urinary tract infection)     Past Surgical History:  Procedure Laterality Date   APPENDECTOMY     LAPAROSCOPIC APPENDECTOMY  11/25/2011   Procedure: APPENDECTOMY LAPAROSCOPIC;  Surgeon: Judie Petit. Leonia Corona, MD;  Location: [facility] OR;  Service: Pediatrics;  Laterality: N/A;    History reviewed. No pertinent family history.  Social:  reports that she has never smoked. She has never used smokeless tobacco. She reports that she does not drink alcohol and does not use drugs.  Allergies: No Known Allergies  Medications: Current Outpatient Medications  Medication Instructions   ranitidine (ZANTAC) 150 mg, Oral, Daily    ROS - all of the below systems have been reviewed with the patient and positives are indicated with bold text General: chills, fever or night sweats Eyes: blurry vision or double vision ENT: epistaxis or sore throat Allergy/Immunology: itchy/watery eyes or nasal congestion Hematologic/Lymphatic: bleeding problems, blood clots or swollen lymph nodes Endocrine: temperature intolerance or unexpected weight changes Breast: new or changing breast lumps or nipple discharge Resp: cough, shortness of breath, or wheezing CV: chest pain or dyspnea on exertion GI: as per HPI GU: dysuria, trouble voiding, or hematuria MSK: joint pain or joint stiffness Neuro: TIA or stroke symptoms Derm: pruritus and skin lesion changes Psych: anxiety and  depression  Objective   PE Blood pressure 95/65, pulse (!) 56, temperature 98.2 F (36.8 C), temperature source Oral, resp. rate 18, height 5\' 7"  (1.702 m), weight 91.6 kg, last menstrual period 07/30/2022, SpO2 97 %. Constitutional: NAD; conversant; no deformities Eyes: Moist conjunctiva; no lid lag; anicteric; PERRL Neck: Trachea midline; no thyromegaly Lungs: Normal respiratory effort; no tactile fremitus CV: RRR; no palpable thrills; no pitting edema GI: Abd Soft, tender RUQ; no palpable hepatosplenomegaly MSK: Normal range of motion of extremities; no clubbing/cyanosis Psychiatric: Appropriate affect; alert and oriented x3 Lymphatic: No palpable cervical or axillary lymphadenopathy  Results for orders placed or performed during the hospital encounter of 08/13/22 (from the past 24 hour(s))  CBC with Differential     Status: None   Collection Time: 08/13/22  2:01 PM  Result Value Ref Range   WBC 8.7 4.0 - 10.5 K/uL   RBC 4.32 3.87 - 5.11 MIL/uL   Hemoglobin 13.6 12.0 - 15.0 g/dL   HCT 16.1 09.6 - 04.5 %   MCV 92.8 80.0 - 100.0 fL   MCH 31.5 26.0 - 34.0 pg   MCHC 33.9 30.0 - 36.0 g/dL   RDW 40.9 81.1 - 91.4 %   Platelets 244 150 - 400 K/uL   nRBC 0.0 0.0 - 0.2 %   Neutrophils Relative % 75 %   Neutro Abs 6.5 1.7 - 7.7 K/uL   Lymphocytes Relative 18 %   Lymphs Abs 1.6 0.7 - 4.0 K/uL   Monocytes Relative 6 %   Monocytes Absolute 0.6 0.1 - 1.0 K/uL   Eosinophils Relative 0 %   Eosinophils Absolute 0.0  0.0 - 0.5 K/uL   Basophils Relative 1 %   Basophils Absolute 0.1 0.0 - 0.1 K/uL   Immature Granulocytes 0 %   Abs Immature Granulocytes 0.02 0.00 - 0.07 K/uL  Comprehensive metabolic panel     Status: None   Collection Time: 08/13/22  2:01 PM  Result Value Ref Range   Sodium 137 135 - 145 mmol/L   Potassium 4.0 3.5 - 5.1 mmol/L   Chloride 104 98 - 111 mmol/L   CO2 25 22 - 32 mmol/L   Glucose, Bld 95 70 - 99 mg/dL   BUN 9 6 - 20 mg/dL   Creatinine, Ser 1.61 0.44 - 1.00  mg/dL   Calcium 9.1 8.9 - 09.6 mg/dL   Total Protein 7.3 6.5 - 8.1 g/dL   Albumin 4.0 3.5 - 5.0 g/dL   AST 27 15 - 41 U/L   ALT 26 0 - 44 U/L   Alkaline Phosphatase 46 38 - 126 U/L   Total Bilirubin 0.7 0.3 - 1.2 mg/dL   GFR, Estimated >04 >54 mL/min   Anion gap 8 5 - 15  Lipase, blood     Status: None   Collection Time: 08/13/22  2:01 PM  Result Value Ref Range   Lipase 27 11 - 51 U/L  Troponin I (High Sensitivity)     Status: None   Collection Time: 08/13/22  2:01 PM  Result Value Ref Range   Troponin I (High Sensitivity) <2 <18 ng/L  Surgical pcr screen     Status: None   Collection Time: 08/13/22  8:59 PM   Specimen: Nasal Mucosa; Nasal Swab  Result Value Ref Range   MRSA, PCR NEGATIVE NEGATIVE   Staphylococcus aureus NEGATIVE NEGATIVE     Imaging Orders    US Abdomen Limited RUQ (LIVER/GB)   08/13/22 Abnormal gallbladder which is dilated. There is wall thickening and wall edema with stones and sludge. Worrisome for acute cholecystitis. If needed, confirmatory HIDA scan could be performed as clinically appropriate. No biliary ductal dilatation.  DG Chest 2 View   08/12/22 No active cardiopulmonary disease.   Assessment and Plan   Kristina Fox is an 27 y.o. female with abdominal pain found on ultrasound to have cholecystitis.  I recommended laparoscopic cholecystectomy.  We discussed the procedure, its risks, benefits and alteratives.  After a full discussion and all questions answered the patient granted consent to proceed.  I discussed the case with Dr. Luisa Hart who will be taking over the patient's care.  Surgery is scheduled for later today.   Quentin Ore, MD  [facility] Use [email or web address] to contact on call provider  New Patient Billing: [id number] - High MDM

## 2022-08-13 NOTE — ED Provider Notes (Signed)
[facility] Provider Note   CSN: [id number] Arrival date & time: 08/13/22  1353     History  Chief Complaint  Patient presents with   Chest Pain    Bereniz Tocci is a 27 y.o. female.   Chest Pain    27 year old female presenting to the emergency department with epigastric pain and chest pain.  The patient was seen in the emergency department yesterday for similar complaint and had a negative cardiac workup and was subsequently discharged.  She still had persistent pain and discomfort and presents today requesting right upper quadrant ultrasound.  She states that her symptoms started yesterday when she developed a tightness in her epigastrium that radiated up to her chest.  She had some associated nausea.  No vomiting.  Pain did not radiate to her back.  Some component of pain in the right upper quadrant.  Workup yesterday reviewed significant for negative troponins, patient was PERC negative, EKG nonischemic.  She endorsed some shortness of breath associated with the pain.    Home Medications Prior to Admission medications   Medication Sig Start Date End Date Taking? Authorizing Provider  ranitidine (ZANTAC) 150 MG capsule Take 1 capsule (150 mg total) by mouth daily. 06/27/16   Alvira Monday, MD      Allergies    Patient has no known allergies.    Review of Systems   Review of Systems  Cardiovascular:  Positive for chest pain.    Physical Exam Updated Vital Signs BP 112/76   Pulse 68   Temp 98.9 F (37.2 C)   Resp 16   Ht 5\' 7"  (1.702 m)   Wt 91.6 kg   LMP 07/30/2022   SpO2 100%   BMI 31.64 kg/m  Physical Exam Vitals and nursing note reviewed.  Constitutional:      General: She is not in acute distress.    Appearance: She is well-developed.  HENT:     Head: Normocephalic and atraumatic.  Eyes:     Conjunctiva/sclera: Conjunctivae normal.  Cardiovascular:     Rate and Rhythm: Normal rate and regular rhythm.      Heart sounds: No murmur heard. Pulmonary:     Effort: Pulmonary effort is normal. No respiratory distress.     Breath sounds: Normal breath sounds.  Abdominal:     Palpations: Abdomen is soft.     Tenderness: There is abdominal tenderness in the right upper quadrant. There is guarding. Positive signs include Murphy's sign.  Musculoskeletal:        General: No swelling.     Cervical back: Neck supple.  Skin:    General: Skin is warm and dry.     Capillary Refill: Capillary refill takes less than 2 seconds.  Neurological:     Mental Status: She is alert.  Psychiatric:        Mood and Affect: Mood normal.     ED Results / Procedures / Treatments   Labs (all labs ordered are listed, but only abnormal results are displayed) Labs Reviewed  CBC WITH DIFFERENTIAL/PLATELET  COMPREHENSIVE METABOLIC PANEL  LIPASE, BLOOD  HIV ANTIBODY (ROUTINE TESTING W REFLEX)  CBC  CREATININE, SERUM  BASIC METABOLIC PANEL  CBC  HEPATIC FUNCTION PANEL  TROPONIN I (HIGH SENSITIVITY)    EKG EKG Interpretation  Date/Time:  Tuesday Aug 13 2022 15:57:16 EDT Ventricular Rate:  62 PR Interval:  145 QRS Duration: 84 QT Interval:  390 QTC Calculation: 396 R Axis:   47  Text Interpretation: Sinus arrhythmia No significant change since last tracing Confirmed by Ernie Avena ([id number]) on 08/13/2022 4:15:51 PM  Radiology DG Chest 2 View  Result Date: 08/13/2022 CLINICAL DATA:  Epigastric pain, substernal chest pain EXAM: CHEST - 2 VIEW COMPARISON:  08/12/2022 FINDINGS: The heart size and mediastinal contours are within normal limits. Both lungs are clear. The visualized skeletal structures are unremarkable. IMPRESSION: No active cardiopulmonary disease. Electronically Signed   By: Judie Petit.  Shick M.D.   On: 08/13/2022 17:47   US Abdomen Limited RUQ (LIVER/GB)  Result Date: 08/13/2022 CLINICAL DATA:  Right upper quadrant pain EXAM: ULTRASOUND ABDOMEN LIMITED RIGHT UPPER QUADRANT COMPARISON:  CT 11/24/2011  FINDINGS: Gallbladder: Distended gallbladder with some wall thickening and adjacent fluid with wall edema. Dependent stones and sludge. No reported sonographic Murphy's sign. Wall thickness approaches 7 mm. Common bile duct: Diameter: 3 mm Liver: No focal lesion identified. Within normal limits in parenchymal echogenicity. Portal vein is patent on color Doppler imaging with normal direction of blood flow towards the liver. Other: None. IMPRESSION: Abnormal gallbladder which is dilated. There is wall thickening and wall edema with stones and sludge. Worrisome for acute cholecystitis. If needed, confirmatory HIDA scan could be performed as clinically appropriate. No biliary ductal dilatation. Electronically Signed   By: Karen Kays M.D.   On: 08/13/2022 16:43   DG Chest 2 View  Result Date: 08/12/2022 CLINICAL DATA:  Chest pain. EXAM: CHEST - 2 VIEW COMPARISON:  None Available. FINDINGS: The heart size and mediastinal contours are within normal limits. Both lungs are clear. The visualized skeletal structures are unremarkable. IMPRESSION: No active cardiopulmonary disease. Electronically Signed   By: Aram Candela M.D.   On: 08/12/2022 03:42    Procedures Procedures    Medications Ordered in ED Medications  ondansetron (ZOFRAN) injection 4 mg (has no administration in time range)  fentaNYL (SUBLIMAZE) injection 50 mcg (has no administration in time range)  enoxaparin (LOVENOX) injection 40 mg (has no administration in time range)  lactated ringers infusion (has no administration in time range)  acetaminophen (TYLENOL) tablet 650 mg (has no administration in time range)  ketorolac (TORADOL) 15 MG/ML injection 15 mg (has no administration in time range)  gabapentin (NEURONTIN) capsule 300 mg (has no administration in time range)  oxyCODONE (Oxy IR/ROXICODONE) immediate release tablet 5 mg (has no administration in time range)  oxyCODONE (Oxy IR/ROXICODONE) immediate release tablet 10 mg (has no  administration in time range)  HYDROmorphone (DILAUDID) injection 1 mg (has no administration in time range)  ondansetron (ZOFRAN) injection 4 mg (has no administration in time range)  prochlorperazine (COMPAZINE) injection 10 mg (has no administration in time range)  simethicone (MYLICON) 40 mg/0.39ml suspension 80 mg (has no administration in time range)  docusate sodium (COLACE) capsule 100 mg (has no administration in time range)    ED Course/ Medical Decision Making/ A&P                             Medical Decision Making Amount and/or Complexity of Data Reviewed Radiology: ordered.  Risk Decision regarding hospitalization.    27 year old female presenting to the emergency department with epigastric pain and chest pain.  The patient was seen in the emergency department yesterday for similar complaint and had a negative cardiac workup and was subsequently discharged.  She still had persistent pain and discomfort and presents today requesting right upper quadrant ultrasound.  She states that her symptoms started yesterday  when she developed a tightness in her epigastrium that radiated up to her chest.  She had some associated nausea.  No vomiting.  Pain did not radiate to her back.  Some component of pain in the right upper quadrant.  Workup yesterday reviewed significant for negative troponins, patient was PERC negative, EKG nonischemic.  She endorsed some shortness of breath associated with the pain.    On arrival, the patient was afebrile, not tachycardic or tachypneic, hemodynamically stable, saturating well on room air.  Physical exam concerning for right upper quadrant and epigastric tenderness to palpation, Guarding present, positive Murphy sign noted on exam.  Concern for acute cholecystitis versus symptomatic cholelithiasis, additionally considered anxiety/panic attack, pancreatitis, GERD, gastritis.  Previously had been ruled out for ACS yesterday in the ER.  PERC negative, low  concern for PE.  Initial EKG revealed sinus arrhythmia, ventricular rate 62, no acute ischemic changes.  A chest x-ray revealed no active cardiopulmonary disease.  Right upper quadrant ultrasound was performed which was concerning for acute cholecystitis as below:  RUQ [place]: IMPRESSION:  Abnormal gallbladder which is dilated. There is wall thickening and  wall edema with stones and sludge. Worrisome for acute  cholecystitis. If needed, confirmatory HIDA scan could be performed  as clinically appropriate.    No biliary ductal dilatation.   I spoke with Dr. Dossie Der, of on-call general surgery who recommended admission to [facility] to their service for consideration for surgical management.  On repeat assessment, the patient's pain was well-controlled.  Fentanyl and Zofran as needed were ordered as needed.  Patient was updated regarding the plan of care.  The patient was subsequently admitted in stable condition.   Final Clinical Impression(s) / ED Diagnoses Final diagnoses:  Acute cholecystitis    Rx / DC Orders ED Discharge Orders     None         Ernie Avena, MD 08/13/22 1758

## 2022-08-13 NOTE — ED Triage Notes (Signed)
Patient arrives ambulatory by POV c/o chest pain worsening throughout the day. Patient states she was seen at [facility] yesterday and had cardiac work up. Requesting her gall bladder to be evaluated.

## 2022-08-13 NOTE — ED Notes (Signed)
Pt ambulatory to restroom with independent steady gait °

## 2022-08-14 ENCOUNTER — Encounter (HOSPITAL_COMMUNITY)
Admission: EM | Disposition: A | Discharge status: Home / Self Care | Payer: Self-pay | Source: Home / Self Care | Attending: Emergency Medicine

## 2022-08-14 ENCOUNTER — Other Ambulatory Visit: Payer: Self-pay

## 2022-08-14 ENCOUNTER — Inpatient Hospital Stay (HOSPITAL_COMMUNITY): Payer: 59 | Admitting: Anesthesiology

## 2022-08-14 ENCOUNTER — Encounter (HOSPITAL_COMMUNITY): Payer: Self-pay

## 2022-08-14 DIAGNOSIS — K810 Acute cholecystitis: Secondary | ICD-10-CM

## 2022-08-14 DIAGNOSIS — F419 Anxiety disorder, unspecified: Secondary | ICD-10-CM

## 2022-08-14 HISTORY — PX: CHOLECYSTECTOMY: SHX55

## 2022-08-14 SURGERY — LAPAROSCOPIC CHOLECYSTECTOMY WITH INTRAOPERATIVE CHOLANGIOGRAM
Anesthesia: General

## 2022-08-14 MED ORDER — LIDOCAINE HCL (PF) 2 % IJ SOLN
INTRAMUSCULAR | Status: AC
  Filled 2022-08-14: qty 5

## 2022-08-14 MED ORDER — FENTANYL CITRATE PF 50 MCG/ML IJ SOSY
PREFILLED_SYRINGE | INTRAMUSCULAR | Status: AC
  Filled 2022-08-14: qty 1

## 2022-08-14 MED ORDER — DEXAMETHASONE SODIUM PHOSPHATE 10 MG/ML IJ SOLN
INTRAMUSCULAR | Status: DC | PRN
  Administered 2022-08-14: 10 mg via INTRAVENOUS

## 2022-08-14 MED ORDER — DEXAMETHASONE SODIUM PHOSPHATE 10 MG/ML IJ SOLN
INTRAMUSCULAR | Status: AC
  Filled 2022-08-14: qty 1

## 2022-08-14 MED ORDER — ONDANSETRON HCL 4 MG/2ML IJ SOLN
INTRAMUSCULAR | Status: DC | PRN
  Administered 2022-08-14: 4 mg via INTRAVENOUS

## 2022-08-14 MED ORDER — SODIUM CHLORIDE 0.9 % IV SOLN
1.0000 g | Freq: Once | INTRAVENOUS | Status: AC
  Administered 2022-08-14: 1 g via INTRAVENOUS
  Filled 2022-08-14: qty 10

## 2022-08-14 MED ORDER — LACTATED RINGERS IV SOLN
INTRAVENOUS | Status: DC | PRN
  Administered 2022-08-14: 1000 mL

## 2022-08-14 MED ORDER — PROPOFOL 10 MG/ML IV BOLUS
INTRAVENOUS | Status: DC | PRN
  Administered 2022-08-14: 200 mg via INTRAVENOUS

## 2022-08-14 MED ORDER — METHOCARBAMOL 500 MG PO TABS
500.0000 mg | ORAL_TABLET | Freq: Three times a day (TID) | ORAL | Status: DC | PRN
  Administered 2022-08-14: 500 mg via ORAL
  Filled 2022-08-14: qty 1

## 2022-08-14 MED ORDER — FENTANYL CITRATE (PF) 100 MCG/2ML IJ SOLN
INTRAMUSCULAR | Status: DC | PRN
  Administered 2022-08-14 (×3): 50 ug via INTRAVENOUS
  Administered 2022-08-14: 100 ug via INTRAVENOUS

## 2022-08-14 MED ORDER — BUPIVACAINE HCL 0.25 % IJ SOLN
INTRAMUSCULAR | Status: AC
  Filled 2022-08-14: qty 1

## 2022-08-14 MED ORDER — FENTANYL CITRATE (PF) 250 MCG/5ML IJ SOLN
INTRAMUSCULAR | Status: AC
  Filled 2022-08-14: qty 5

## 2022-08-14 MED ORDER — ORAL CARE MOUTH RINSE: 15.0000 mL | Freq: Once | OROMUCOSAL | Status: AC

## 2022-08-14 MED ORDER — AMISULPRIDE (ANTIEMETIC) 5 MG/2ML IV SOLN: 10.0000 mg | Freq: Once | INTRAVENOUS | Status: DC | PRN

## 2022-08-14 MED ORDER — PROPOFOL 10 MG/ML IV BOLUS
INTRAVENOUS | Status: AC
  Filled 2022-08-14: qty 20

## 2022-08-14 MED ORDER — ONDANSETRON HCL 4 MG/2ML IJ SOLN
INTRAMUSCULAR | Status: AC
  Filled 2022-08-14: qty 2

## 2022-08-14 MED ORDER — MIDAZOLAM HCL 2 MG/2ML IJ SOLN
INTRAMUSCULAR | Status: AC
  Filled 2022-08-14: qty 2

## 2022-08-14 MED ORDER — ROCURONIUM BROMIDE 10 MG/ML (PF) SYRINGE
PREFILLED_SYRINGE | INTRAVENOUS | Status: DC | PRN
  Administered 2022-08-14: 60 mg via INTRAVENOUS

## 2022-08-14 MED ORDER — LACTATED RINGERS IV SOLN: INTRAVENOUS | Status: DC

## 2022-08-14 MED ORDER — SUGAMMADEX SODIUM 200 MG/2ML IV SOLN
INTRAVENOUS | Status: DC | PRN
  Administered 2022-08-14: 200 mg via INTRAVENOUS

## 2022-08-14 MED ORDER — CHLORHEXIDINE GLUCONATE 0.12 % MT SOLN
15.0000 mL | Freq: Once | OROMUCOSAL | Status: AC
  Administered 2022-08-14: 15 mL via OROMUCOSAL

## 2022-08-14 MED ORDER — FENTANYL CITRATE PF 50 MCG/ML IJ SOSY
25.0000 ug | PREFILLED_SYRINGE | INTRAMUSCULAR | Status: DC | PRN
  Administered 2022-08-14 (×2): 50 ug via INTRAVENOUS

## 2022-08-14 MED ORDER — PROMETHAZINE HCL 25 MG/ML IJ SOLN: 6.2500 mg | INTRAMUSCULAR | Status: DC | PRN

## 2022-08-14 MED ORDER — ACETAMINOPHEN 500 MG PO TABS
1000.0000 mg | ORAL_TABLET | Freq: Once | ORAL | Status: AC
  Administered 2022-08-14: 1000 mg via ORAL
  Filled 2022-08-14: qty 2

## 2022-08-14 MED ORDER — SCOPOLAMINE 1 MG/3DAYS TD PT72
1.0000 | MEDICATED_PATCH | TRANSDERMAL | Status: DC
  Administered 2022-08-14: 1.5 mg via TRANSDERMAL
  Filled 2022-08-14: qty 1

## 2022-08-14 MED ORDER — LIDOCAINE 2% (20 MG/ML) 5 ML SYRINGE
INTRAMUSCULAR | Status: DC | PRN
  Administered 2022-08-14: 80 mg via INTRAVENOUS

## 2022-08-14 MED ORDER — ROCURONIUM BROMIDE 10 MG/ML (PF) SYRINGE
PREFILLED_SYRINGE | INTRAVENOUS | Status: AC
  Filled 2022-08-14: qty 10

## 2022-08-14 MED ORDER — OXYCODONE HCL 5 MG PO TABS: 5.0000 mg | ORAL_TABLET | ORAL | Status: DC | PRN

## 2022-08-14 MED ORDER — BUPIVACAINE HCL (PF) 0.25 % IJ SOLN
INTRAMUSCULAR | Status: DC | PRN
  Administered 2022-08-14: 10 mL

## 2022-08-14 MED ORDER — MIDAZOLAM HCL 5 MG/5ML IJ SOLN
INTRAMUSCULAR | Status: DC | PRN
  Administered 2022-08-14: 2 mg via INTRAVENOUS

## 2022-08-14 SURGICAL SUPPLY — 33 items
ADH SKN CLS APL DERMABOND .7 (GAUZE/BANDAGES/DRESSINGS) ×1
APL PRP STRL LF DISP 70% ISPRP (MISCELLANEOUS) ×1
APPLIER CLIP ROT 10 11.4 M/L (STAPLE) ×1
APR CLP MED LRG 11.4X10 (STAPLE) ×1
BAG SPEC RTRVL 10 TROC 200 (ENDOMECHANICALS) ×1
CHLORAPREP W/TINT 26 (MISCELLANEOUS) ×2 IMPLANT
CLIP APPLIE ROT 10 11.4 M/L (STAPLE) ×2 IMPLANT
COVER MAYO STAND XLG (MISCELLANEOUS) IMPLANT
DERMABOND ADVANCED .7 DNX12 (GAUZE/BANDAGES/DRESSINGS) ×2 IMPLANT
DRAPE C-ARM 42X120 X-RAY (DRAPES) IMPLANT
ELECT REM PT RETURN 15FT ADLT (MISCELLANEOUS) ×2 IMPLANT
GLOVE INDICATOR 8.0 STRL GRN (GLOVE) ×2 IMPLANT
GLOVE SS BIOGEL STRL SZ 8 (GLOVE) ×2 IMPLANT
GOWN STRL REUS W/ TWL XL LVL3 (GOWN DISPOSABLE) ×4 IMPLANT
GOWN STRL REUS W/TWL XL LVL3 (GOWN DISPOSABLE) ×2
HEMOSTAT SURGICEL 4X8 (HEMOSTASIS) IMPLANT
IRRIG SUCT STRYKERFLOW 2 WTIP (MISCELLANEOUS) ×1
IRRIGATION SUCT STRKRFLW 2 WTP (MISCELLANEOUS) ×2 IMPLANT
KIT BASIN OR (CUSTOM PROCEDURE TRAY) ×2 IMPLANT
KIT TURNOVER KIT A (KITS) IMPLANT
POUCH RETRIEVAL ECOSAC 10 (ENDOMECHANICALS) IMPLANT
SCISSORS LAP 5X35 DISP (ENDOMECHANICALS) ×2 IMPLANT
SET CHOLANGIOGRAPH MIX (MISCELLANEOUS) IMPLANT
SET TUBE SMOKE EVAC HIGH FLOW (TUBING) ×2 IMPLANT
SLEEVE Z-THREAD 5X100MM (TROCAR) ×2 IMPLANT
SPIKE FLUID TRANSFER (MISCELLANEOUS) ×2 IMPLANT
SUT MNCRL AB 4-0 PS2 18 (SUTURE) ×2 IMPLANT
TOWEL OR 17X26 10 PK STRL BLUE (TOWEL DISPOSABLE) ×2 IMPLANT
TOWEL OR NON WOVEN STRL DISP B (DISPOSABLE) ×2 IMPLANT
TRAY LAPAROSCOPIC (CUSTOM PROCEDURE TRAY) ×2 IMPLANT
TROCAR 11X100 Z THREAD (TROCAR) ×2 IMPLANT
TROCAR BALLN 12MMX100 BLUNT (TROCAR) ×2 IMPLANT
TROCAR Z-THREAD OPTICAL 5X100M (TROCAR) ×2 IMPLANT

## 2022-08-14 NOTE — Interval H&P Note (Signed)
History and Physical Interval Note:  08/14/2022 12:21 PM  Kristina Fox  has presented today for surgery, with the diagnosis of acut cholecystitis.  The various methods of treatment have been discussed with the patient and family. After consideration of risks, benefits and other options for treatment, the patient has consented to  Procedure(s): LAPAROSCOPIC CHOLECYSTECTOMY WITH POSSIBLE INTRAOPERATIVE CHOLANGIOGRAM (N/A) as a surgical intervention.  The patient's history has been reviewed, patient examined, no change in status, stable for surgery.  I have reviewed the patient's chart and labs.  Questions were answered to the patient's satisfaction.    The procedure has been discussed with the patient. Operative and non operative treatments have been discussed. Risks of surgery include bleeding, infection,  Common bile duct injury,  Injury to the stomach,liver, colon,small intestine, abdominal wall,  Diaphragm,  Major blood vessels,  And the need for an open procedure.  Other risks include worsening of medical problems, death,  DVT and pulmonary embolism, and cardiovascular events.   Medical options have also been discussed. The patient has been informed of long term expectations of surgery and non surgical options,  The patient agrees to proceed.    Tishia Maestre A Billie Intriago

## 2022-08-14 NOTE — Anesthesia Postprocedure Evaluation (Signed)
Anesthesia Post Note  Patient: Kristina Fox  Procedure(s) Performed: LAPAROSCOPIC CHOLECYSTECTOMY     Patient location during evaluation: PACU Anesthesia Type: General Level of consciousness: awake Pain management: pain level controlled Vital Signs Assessment: post-procedure vital signs reviewed and stable Respiratory status: spontaneous breathing, nonlabored ventilation and respiratory function stable Cardiovascular status: blood pressure returned to baseline and stable Postop Assessment: no apparent nausea or vomiting Anesthetic complications: no   No notable events documented.  Last Vitals:  Vitals:   08/14/22 1732 08/14/22 1835  BP: 116/67 119/72  Pulse: 68 62  Resp:    Temp: 36.8 C 36.7 C  SpO2: 100% 100%    Last Pain:  Vitals:   08/14/22 1927  TempSrc:   PainSc: 7                  Cove Haydon P Keiana Tavella

## 2022-08-14 NOTE — Progress Notes (Signed)
27 year old female patient presented to the ED with severe epigastric pain. Imaging confirmed acute cholecystitis.Patient is alert and oriented times four. Patient heart sounds are within normal limits with no evidence of murmur. Patients lungs are within limits with no evidence of crackles or wheezing. Patient abdomen is soft and tender to touch. Patient states pain is a 3 on a scale of 1-10. Patient went down to pre-op at 1045 and finished with the Laparoscopic cholecystectomy at 1445. Patient is now in recovery at PACU.     Student RN, Waldo Laine

## 2022-08-14 NOTE — Transfer of Care (Signed)
Immediate Anesthesia Transfer of Care Note  Patient: Kristina Fox  Procedure(s) Performed: LAPAROSCOPIC CHOLECYSTECTOMY  Patient Location: PACU  Anesthesia Type:General  Level of Consciousness: awake, alert , and oriented  Airway & Oxygen Therapy: Patient Spontanous Breathing and Patient connected to face mask oxygen  Post-op Assessment: Report given to RN and Post -op Vital signs reviewed and stable  Post vital signs: Reviewed and stable  Last Vitals:  Vitals Value Taken Time  BP 126/84 08/14/22 1436  Temp    Pulse 74 08/14/22 1438  Resp 18 08/14/22 1438  SpO2 100 % 08/14/22 1438  Vitals shown include unvalidated device data.  Last Pain:  Vitals:   08/14/22 1119  TempSrc:   PainSc: 2       Patients Stated Pain Goal: 0 (08/14/22 1119)  Complications: No notable events documented.

## 2022-08-14 NOTE — Op Note (Signed)
Preoperative diagnosis: Acute cholecystitis  Operative diagnosis: Same  Procedure: Laparoscopic cholecystectomy  Surgeon: Harriette Bouillon, MD  Anesthesia: General with 0.25% Marcaine plain  EBL: 20 cc  Drains: None  Specimen: Gallbladder to pathology  Indications for procedure: The patient is a 27 year old female admitted with acute cholecystitis.  She presents room today for operative management of her acute cholecystitis after discussing over treatment options to include surgical and nonsurgical options.The procedure has been discussed with the patient. Operative and non operative treatments have been discussed. Risks of surgery include bleeding, infection,  Common bile duct injury,  Injury to the stomach,liver, colon,small intestine, abdominal wall,  Diaphragm,  Major blood vessels,  And the need for an open procedure.  Other risks include worsening of medical problems, death,  DVT and pulmonary embolism, and cardiovascular events.   Medical options have also been discussed. The patient has been informed of long term expectations of surgery and non surgical options,  The patient agrees to proceed.      Description of procedure: The patient was met in the holding area questions were answered.  She was taken back to the operating placed upon upon the operating room table.  After induction of general anesthesia, the abdomen was prepped and draped in sterile fashion and timeout performed.  A 1 cm infraumbilical incision was made.  Dissection was carried down to the fascia the fascia was opened the midline.  A 12 mm port was placed under direct vision and secured to the fascia with the pursestring suture of 0 Vicryl  Insufflation to 15 mmHg CO2 pressure done under direct vision.  No evidence of injury to the internal viscera with placement of this port.  I then placed an 11 mm subxiphoid port.  Two 5 mm ports were placed the right upper quadrant.  The patient was placed in reverse Trendelenburg and  rolled her left.  The gallbladder identified and showed signs of acute cholecystitis.  The dome was grasped and retracted the patient's right shoulder.  The site grasper was used to grab the infundibulum and retracted to the patient's right lower quadrant.  This exposed the triangle of CLO.  Dissection was done at the junction of the cystic duct and gallbladder.  Of note I could visualize the common bile duct directly in this patient we were away from it.  Family was made around the neck of the gallbladder.  The critical view obtained.  The cystic duct was extremely small and I did not feel would accommodate a cholangiogram catheter at all.  Her LFTs are normal and there is no signs of any ductal dilatation.  A clip was placed the gallbladder side of this and 3 clips were placed on the patient's side of the stents was divided under direct vision.  The cystic artery identified divided similar fashion.  The posterior branch of the cystic artery was divided as well.  We then used a hook cautery to excise the gallbladder from gallbladder fossa.  This was placed into an EKOS sac bag.  The gallbladder bed made hemostatic with cautery and irrigation.  All particulate matter was irrigated and suctioned out.  There is no bleeding.  No evidence of bile leakage.  Clips were on the appropriate structures and the common duct was visualized and there is no evidence of injury to it.  Four-quadrant laparoscopy performed and no evidence of bowel injury or other internal viscera or injury.  The gallbladder was removed to the umbilical port and the fascia was closed  with a pursestring suture of 0 Vicryl.  Reinspection showed no evidence of bleeding or bile leakage.  All the ports were removed and CO2 was allowed to escape.  Skin closed with 4 Monocryl.  Dermabond applied.  Counts found to be correct.  The patient was awoke extubated taken to recovery in satisfactory condition.

## 2022-08-14 NOTE — Anesthesia Preprocedure Evaluation (Signed)
Anesthesia Evaluation    Reviewed: Allergy & Precautions, Patient's Chart, lab work & pertinent test results  Airway Mallampati: II  TM Distance: >3 FB Neck ROM: Full    Dental no notable dental hx. (+) Dental Advisory Given   Pulmonary neg pulmonary ROS   Pulmonary exam normal        Cardiovascular negative cardio ROS Normal cardiovascular exam     Neuro/Psych  PSYCHIATRIC DISORDERS Anxiety     negative neurological ROS     GI/Hepatic negative GI ROS, Neg liver ROS,,,IBS   Endo/Other  negative endocrine ROS    Renal/GU negative Renal ROS     Musculoskeletal negative musculoskeletal ROS (+)    Abdominal   Peds  Hematology negative hematology ROS (+)   Anesthesia Other Findings   Reproductive/Obstetrics                             Anesthesia Physical Anesthesia Plan  ASA: 2  Anesthesia Plan: General   Post-op Pain Management: Tylenol PO (pre-op)* and Toradol IV (intra-op)*   Induction: Intravenous  PONV Risk Score and Plan: 4 or greater and Ondansetron, Dexamethasone, Scopolamine patch - Pre-op and Midazolam  Airway Management Planned: Oral ETT  Additional Equipment:   Intra-op Plan:   Post-operative Plan: Extubation in OR  Informed Consent: I have reviewed the patients History and Physical, chart, labs and discussed the procedure including the risks, benefits and alternatives for the proposed anesthesia with the patient or authorized representative who has indicated his/her understanding and acceptance.     Dental advisory given  Plan Discussed with: Anesthesiologist  Anesthesia Plan Comments:        Anesthesia Quick Evaluation

## 2022-08-14 NOTE — Discharge Instructions (Signed)

## 2022-08-14 NOTE — Anesthesia Procedure Notes (Signed)
Procedure Name: Intubation Date/Time: 08/14/2022 1:08 PM  Performed by: Kizzie Fantasia, CRNAPre-anesthesia Checklist: Patient identified, Emergency Drugs available, Suction available, Patient being monitored and Timeout performed Patient Re-evaluated:Patient Re-evaluated prior to induction Oxygen Delivery Method: Circle system utilized Preoxygenation: Pre-oxygenation with 100% oxygen Induction Type: IV induction Ventilation: Mask ventilation without difficulty Laryngoscope Size: Mac and 4 Grade View: Grade I Tube type: Oral Tube size: 7.0 mm Number of attempts: 1 Airway Equipment and Method: Stylet Placement Confirmation: ETT inserted through vocal cords under direct vision, positive ETCO2 and breath sounds checked- equal and bilateral Secured at: 21 cm Tube secured with: Tape Dental Injury: Teeth and Oropharynx as per pre-operative assessment

## 2022-08-15 ENCOUNTER — Encounter (HOSPITAL_COMMUNITY): Payer: Self-pay | Admitting: Surgery

## 2022-08-15 LAB — HEPATIC FUNCTION PANEL
ALT: 191 U/L — ABNORMAL HIGH (ref 0–44)
AST: 216 U/L — ABNORMAL HIGH (ref 15–41)
Albumin: 3.3 g/dL — ABNORMAL LOW (ref 3.5–5.0)
Alkaline Phosphatase: 47 U/L (ref 38–126)
Bilirubin, Direct: 0.1 mg/dL (ref 0.0–0.2)
Indirect Bilirubin: 0.5 mg/dL (ref 0.3–0.9)
Total Bilirubin: 0.6 mg/dL (ref 0.3–1.2)
Total Protein: 6.2 g/dL — ABNORMAL LOW (ref 6.5–8.1)

## 2022-08-15 LAB — CBC
HCT: 37.1 % (ref 36.0–46.0)
Hemoglobin: 12.6 g/dL (ref 12.0–15.0)
MCH: 32.2 pg (ref 26.0–34.0)
MCHC: 34 g/dL (ref 30.0–36.0)
MCV: 94.9 fL (ref 80.0–100.0)
Platelets: 218 10*3/uL (ref 150–400)
RBC: 3.91 MIL/uL (ref 3.87–5.11)
RDW: 12.8 % (ref 11.5–15.5)
WBC: 15.4 10*3/uL — ABNORMAL HIGH (ref 4.0–10.5)
nRBC: 0 % (ref 0.0–0.2)

## 2022-08-15 LAB — SURGICAL PATHOLOGY

## 2022-08-15 LAB — BASIC METABOLIC PANEL
Anion gap: 7 (ref 5–15)
BUN: 7 mg/dL (ref 6–20)
CO2: 24 mmol/L (ref 22–32)
Calcium: 8.6 mg/dL — ABNORMAL LOW (ref 8.9–10.3)
Chloride: 106 mmol/L (ref 98–111)
Creatinine, Ser: 0.8 mg/dL (ref 0.44–1.00)
GFR, Estimated: >60 mL/min (ref >60)
Glucose, Bld: 133 mg/dL — ABNORMAL HIGH (ref 70–99)
Potassium: 4 mmol/L (ref 3.5–5.1)
Sodium: 137 mmol/L (ref 135–145)

## 2022-08-15 MED ORDER — OXYCODONE HCL 5 MG PO TABS: 5.0000 mg | ORAL_TABLET | Freq: Four times a day (QID) | ORAL | 0 refills | Status: AC | PRN

## 2022-08-15 MED ORDER — ACETAMINOPHEN 325 MG PO TABS: 650.0000 mg | ORAL_TABLET | Freq: Four times a day (QID) | ORAL | Status: AC | PRN

## 2022-08-15 MED ORDER — IBUPROFEN 200 MG PO TABS: 400.0000 mg | ORAL_TABLET | Freq: Four times a day (QID) | ORAL | Status: AC | PRN

## 2022-08-20 NOTE — Discharge Summary (Signed)
[facility] Discharge Summary   Patient ID: Kristina Fox MRN: [id number] DOB/AGE: [birth year] 26 y.o.  Admit date: 08/13/2022 Discharge date: 08/15/2022  Admitting Diagnosis: Cholecystitis   Discharge Diagnosis Patient Active Problem List   Diagnosis Date Noted   Acute cholecystitis 08/13/2022    Consultants None   Imaging: No results found.  Procedures Dr. Luisa Fox (08/14/22) - Laparoscopic Cholecystectomy   Hospital Course:  27 y/o F who presented to the ED with epigastric pain.  Workup showed cholecystitis.  Patient was admitted and underwent procedure listed above.  Tolerated procedure well and was transferred to the floor.  Diet was advanced as tolerated.  On POD#1, the patient was voiding well, tolerating diet, ambulating well, pain well controlled, vital signs stable, incisions c/d/i and felt stable for discharge home.  Patient will follow up in our office in 3 weeks and knows to call with questions or concerns.    I have personally reviewed the patients medication history on the [place] controlled substance database.   Physical Exam: General:  Alert, NAD, pleasant, comfortable Abd:  Soft, ND, mild tenderness, incisions C/D/I   Allergies as of 08/15/2022   No Known Allergies      Medication List     TAKE these medications    acetaminophen 325 MG tablet Commonly known as: TYLENOL Take 2-3 tablets (650-975 mg total) by mouth every 6 (six) hours as needed.   ibuprofen 200 MG tablet Commonly known as: ADVIL Take 2-3 tablets (400-600 mg total) by mouth every 6 (six) hours as needed for mild pain.   oxyCODONE 5 MG immediate release tablet Commonly known as: Oxy IR/ROXICODONE Take 1 tablet (5 mg total) by mouth every 6 (six) hours as needed for severe pain (not relieved by tylenol or advil).   ranitidine 150 MG capsule Commonly known as: ZANTAC Take 1 capsule (150 mg total) by mouth daily.          Follow-up Information     Maczis, Hedda Slade, PA-C Follow up on 09/03/2022.   Specialty: General Surgery Why: at 11:30 am, For post-operative follow up. please arrive 20-30 minutes early and bring photo ID. Contact information: [address] [place] [address] [phone]                 Signed: Hosie Fox, [facility] 08/20/2022, 11:42 AM
